# Patient Record
Sex: Female | Born: 1990 | Race: Black or African American | Hispanic: No | Marital: Single | Smoking: Never smoker
Health system: Southern US, Community
[De-identification: ages and names within clinical notes are randomized; demographics above are authoritative.]

---

## 2013-01-21 ENCOUNTER — Encounter (HOSPITAL_COMMUNITY): Payer: Self-pay

## 2013-01-21 ENCOUNTER — Emergency Department (HOSPITAL_COMMUNITY)
Admission: EM | Admit: 2013-01-21 | Discharge: 2013-01-21 | Disposition: A | Discharge status: Home / Self Care | Payer: 59 | Source: Home / Self Care

## 2013-01-21 DIAGNOSIS — B349 Viral infection, unspecified: Secondary | ICD-10-CM

## 2013-01-21 DIAGNOSIS — J069 Acute upper respiratory infection, unspecified: Secondary | ICD-10-CM

## 2013-01-21 DIAGNOSIS — B9789 Other viral agents as the cause of diseases classified elsewhere: Secondary | ICD-10-CM

## 2013-01-21 MED ORDER — FLUTICASONE PROPIONATE 50 MCG/ACT NA SUSP: 2.0000 | Freq: Every day | NASAL | Status: DC

## 2013-01-21 MED ORDER — PHENYLEPHRINE-CHLORPHEN-DM 10-4-12.5 MG/5ML PO LIQD: 5.0000 mL | ORAL | Status: DC | PRN

## 2013-01-21 NOTE — ED Provider Notes (Signed)
CSN: [id number]     Arrival date & time 01/21/13  8469 History   None    Chief Complaint  Patient presents with  . Cough   (Consider location/radiation/quality/duration/timing/severity/associated sxs/prior Treatment) HPI Comments: Awoke feeling poorly yesterday with itchy throat, runny nose, head congestion, Headache , bodyache, occassional nausea without vomiting. Took Sudafed and Loratadine.   No past medical history on file. No past surgical history on file. No family history on file. History  Substance Use Topics  . Smoking status: Not on file  . Smokeless tobacco: Not on file  . Alcohol Use: Not on file   OB History   No data available     Review of Systems  Constitutional: Positive for activity change, appetite change and fatigue. Negative for fever and chills.  HENT: Positive for congestion, sore throat, rhinorrhea and postnasal drip. Negative for facial swelling, neck pain and neck stiffness.   Eyes: Negative.   Respiratory: Negative.   Cardiovascular: Negative.   Gastrointestinal: Positive for nausea. Negative for vomiting.  Musculoskeletal: Positive for myalgias.  Skin: Negative for pallor and rash.  Neurological: Negative.     Allergies  Review of patient's allergies indicates not on file.  Home Medications   Current Outpatient Rx  Name  Route  Sig  Dispense  Refill  . fluticasone (FLONASE) 50 MCG/ACT nasal spray   Nasal   Place 2 sprays into the nose daily.   16 g   2   . Phenylephrine-Chlorphen-DM 02-22-11.5 MG/5ML LIQD   Oral   Take 5 mLs by mouth every 4 (four) hours as needed.   120 mL   0    BP 106/69  Pulse 90  Temp(Src) 100.1 F (37.8 C) (Oral)  Resp 16  SpO2 98% Physical Exam  Nursing note and vitals reviewed. Constitutional: She is oriented to person, place, and time. She appears well-developed and well-nourished. No distress.  HENT:  Head: Normocephalic.  Right Ear: External ear normal.  Left Ear: External ear normal.  Difficult  to visualize due to voluntary tongue retraction with approach to exam, also pulling my arm away. Had one short glimpse. Minor erythema, no bright exudates.   Eyes: Conjunctivae and EOM are normal.  Neck: Normal range of motion. Neck supple.  Cardiovascular: Normal rate and regular rhythm.   Pulmonary/Chest: Effort normal and breath sounds normal. No respiratory distress. She has no rales.  Abdominal: Soft. There is no tenderness.  Musculoskeletal: Normal range of motion.  Lymphadenopathy:    She has no cervical adenopathy.  Neurological: She is alert and oriented to person, place, and time. No cranial nerve deficit.  Skin: Skin is warm and dry.  Psychiatric: She has a normal mood and affect.    ED Course  Procedures (including critical care time) Labs Review Labs Reviewed  POCT RAPID STREP A ([facility] ONLY)   Results for orders placed during the hospital encounter of 01/21/13  POCT RAPID STREP A ([facility] ONLY)      Result Value Range   Streptococcus, Group A Screen (Direct) NEGATIVE  NEGATIVE    Imaging Review No results found.  MDM   1. URI (upper respiratory infection)   2. Viral syndrome    Fluticasone NS as dir Plenty of fluids Norel CS as directed Rest. No work next 2 days      Hayden Rasmussen, NP 01/21/13 1049

## 2013-01-21 NOTE — ED Provider Notes (Signed)
Medical screening examination/treatment/procedure(s) were performed by non-physician practitioner and as supervising physician I was immediately available for consultation/collaboration.   MORENO-COLL,Nyle Limb; MD  Jazzlin Clements Moreno-Coll, MD 01/21/13 1416 

## 2013-01-23 LAB — CULTURE, GROUP A STREP

## 2013-05-19 ENCOUNTER — Emergency Department (HOSPITAL_COMMUNITY)
Admission: EM | Admit: 2013-05-19 | Discharge: 2013-05-19 | Disposition: A | Discharge status: Home / Self Care | Payer: No Typology Code available for payment source | Attending: Emergency Medicine | Admitting: Emergency Medicine

## 2013-05-19 ENCOUNTER — Emergency Department (HOSPITAL_COMMUNITY): Payer: No Typology Code available for payment source

## 2013-05-19 ENCOUNTER — Encounter (HOSPITAL_COMMUNITY): Payer: Self-pay | Admitting: Emergency Medicine

## 2013-05-19 DIAGNOSIS — S6990XA Unspecified injury of unspecified wrist, hand and finger(s), initial encounter: Secondary | ICD-10-CM | POA: Insufficient documentation

## 2013-05-19 DIAGNOSIS — S298XXA Other specified injuries of thorax, initial encounter: Secondary | ICD-10-CM | POA: Insufficient documentation

## 2013-05-19 DIAGNOSIS — S0990XA Unspecified injury of head, initial encounter: Secondary | ICD-10-CM | POA: Insufficient documentation

## 2013-05-19 DIAGNOSIS — Y9389 Activity, other specified: Secondary | ICD-10-CM | POA: Insufficient documentation

## 2013-05-19 DIAGNOSIS — Y92410 Unspecified street and highway as the place of occurrence of the external cause: Secondary | ICD-10-CM | POA: Insufficient documentation

## 2013-05-19 DIAGNOSIS — S59909A Unspecified injury of unspecified elbow, initial encounter: Secondary | ICD-10-CM | POA: Insufficient documentation

## 2013-05-19 DIAGNOSIS — S161XXA Strain of muscle, fascia and tendon at neck level, initial encounter: Secondary | ICD-10-CM

## 2013-05-19 DIAGNOSIS — S139XXA Sprain of joints and ligaments of unspecified parts of neck, initial encounter: Secondary | ICD-10-CM | POA: Insufficient documentation

## 2013-05-19 DIAGNOSIS — Z79899 Other long term (current) drug therapy: Secondary | ICD-10-CM | POA: Insufficient documentation

## 2013-05-19 MED ORDER — CYCLOBENZAPRINE HCL 10 MG PO TABS: 10.0000 mg | ORAL_TABLET | Freq: Two times a day (BID) | ORAL | Status: DC | PRN
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
## 2013-05-20 NOTE — ED Provider Notes (Signed)
Medical screening examination/treatment/procedure(s) were performed by non-physician practitioner and as supervising physician I was immediately available for consultation/collaboration.  Nabria Nevin L Messiyah Waterson, MD 05/20/13 1850 

## 2013-11-25 ENCOUNTER — Emergency Department (HOSPITAL_COMMUNITY)
Admission: EM | Admit: 2013-11-25 | Discharge: 2013-11-25 | Disposition: A | Discharge status: Home / Self Care | Payer: 59 | Attending: Emergency Medicine | Admitting: Emergency Medicine

## 2013-11-25 ENCOUNTER — Emergency Department (HOSPITAL_COMMUNITY): Payer: 59

## 2013-11-25 ENCOUNTER — Encounter (HOSPITAL_COMMUNITY): Payer: Self-pay | Admitting: Emergency Medicine

## 2013-11-25 DIAGNOSIS — R51 Headache: Secondary | ICD-10-CM | POA: Insufficient documentation

## 2013-11-25 DIAGNOSIS — H53149 Visual discomfort, unspecified: Secondary | ICD-10-CM | POA: Diagnosis not present

## 2013-11-25 DIAGNOSIS — R519 Headache, unspecified: Secondary | ICD-10-CM

## 2013-11-25 DIAGNOSIS — R112 Nausea with vomiting, unspecified: Secondary | ICD-10-CM | POA: Insufficient documentation

## 2013-11-25 MED ORDER — ONDANSETRON 8 MG PO TBDP: 8.0000 mg | ORAL_TABLET | Freq: Three times a day (TID) | ORAL | Status: AC | PRN

## 2013-11-25 MED ORDER — ONDANSETRON HCL 4 MG/2ML IJ SOLN
4.0000 mg | Freq: Once | INTRAMUSCULAR | Status: AC
  Administered 2013-11-25: 4 mg via INTRAVENOUS
  Filled 2013-11-25: qty 2

## 2013-11-25 MED ORDER — HYDROMORPHONE HCL PF 1 MG/ML IJ SOLN
0.5000 mg | INTRAMUSCULAR | Status: DC | PRN
  Filled 2013-11-25: qty 1

## 2013-11-25 MED ORDER — METOCLOPRAMIDE HCL 5 MG/ML IJ SOLN
10.0000 mg | Freq: Once | INTRAMUSCULAR | Status: AC
  Administered 2013-11-25: 10 mg via INTRAVENOUS
  Filled 2013-11-25: qty 2

## 2013-11-25 MED ORDER — SODIUM CHLORIDE 0.9 % IV SOLN
1000.0000 mL | INTRAVENOUS | Status: DC
  Administered 2013-11-25: 1000 mL via INTRAVENOUS

## 2013-11-25 MED ORDER — NAPROXEN 500 MG PO TABS: 500.0000 mg | ORAL_TABLET | Freq: Two times a day (BID) | ORAL | Status: AC

## 2013-11-25 MED ORDER — SODIUM CHLORIDE 0.9 % IV SOLN
1000.0000 mL | Freq: Once | INTRAVENOUS | Status: AC
  Administered 2013-11-25: 1000 mL via INTRAVENOUS

## 2013-11-25 MED ORDER — HYDROCODONE-ACETAMINOPHEN 5-325 MG PO TABS: 1.0000 | ORAL_TABLET | ORAL | Status: AC | PRN

## 2013-11-25 MED ORDER — KETOROLAC TROMETHAMINE 30 MG/ML IJ SOLN
30.0000 mg | Freq: Once | INTRAMUSCULAR | Status: AC
  Administered 2013-11-25: 30 mg via INTRAVENOUS
  Filled 2013-11-25: qty 1

## 2013-11-25 NOTE — ED Notes (Signed)
EMS - Pt coming from work with sudden onset of severe headache with nausea and vomiting x5 this morning.  Pt states she started exercising yesterday and may have over did it.  Pt has light sensitivity and blurred vision bilaterally, denies neck stiffness.  Pt given 4mg  Zofran with EMS.  Pt is alert and oriented.

## 2013-11-25 NOTE — ED Notes (Signed)
Pt alert and oriented at discharge.  Pt offered a wheelchair but declined, ambulated to the waiting room.

## 2013-11-25 NOTE — ED Provider Notes (Signed)
CSN: [id number]     Arrival date & time 11/25/13  1109 History   First MD Initiated Contact with Patient 11/25/13 1113     Chief Complaint  Patient presents with  . Headache     HPI Patient presents with headache that began today.  Initially it started out gradually but then quickly worsened.  She reports photophobia.  She reports nausea and vomiting.  No recent trauma.  No recent illness.  No fevers or chills.  She had one headache similar to this many years ago but does not carry a history of migraine headaches.  She was involved in more extensive exercise yesterday that is typical for her.  She feels as though her hydration status has been well.  Denies bloody emesis.  No abdominal pain.  No neck pain.   History reviewed. No pertinent past medical history. History reviewed. No pertinent past surgical history. History reviewed. No pertinent family history. History  Substance Use Topics  . Smoking status: Never Smoker   . Smokeless tobacco: Not on file  . Alcohol Use: Yes     Comment: occassional   OB History   Grav Para Term Preterm Abortions TAB SAB Ect Mult Living                 Review of Systems  All other systems reviewed and are negative.     Allergies  Review of patient's allergies indicates no known allergies.  Home Medications   Prior to Admission medications   Medication Sig Start Date End Date Taking? Authorizing Provider  HYDROcodone-acetaminophen (NORCO/VICODIN) 5-325 MG per tablet Take 1 tablet by mouth every 4 (four) hours as needed for moderate pain. 11/25/13   Lyanne CoKevin M Damari Suastegui, MD  naproxen (NAPROSYN) 500 MG tablet Take 1 tablet (500 mg total) by mouth 2 (two) times daily. 11/25/13   Lyanne CoKevin M Javonne Louissaint, MD  ondansetron (ZOFRAN ODT) 8 MG disintegrating tablet Take 1 tablet (8 mg total) by mouth every 8 (eight) hours as needed for nausea or vomiting. 11/25/13   Lyanne CoKevin M Sharanda Shinault, MD   BP 92/72  Pulse 88  Temp(Src) 97.7 F (36.5 C) (Oral)  Resp 16  Ht 5\' 3"  (1.6 m)   Wt 125 lb (56.7 kg)  BMI 22.15 kg/m2  SpO2 100%  LMP 11/10/2013 Physical Exam  Nursing note and vitals reviewed. Constitutional: She is oriented to person, place, and time. She appears well-developed and well-nourished. No distress.  HENT:  Head: Normocephalic and atraumatic.  Eyes: EOM are normal. Pupils are equal, round, and reactive to light.  Neck: Normal range of motion.  Cardiovascular: Normal rate, regular rhythm and normal heart sounds.   Pulmonary/Chest: Effort normal and breath sounds normal.  Abdominal: Soft. She exhibits no distension. There is no tenderness.  Musculoskeletal: Normal range of motion.  Neurological: She is alert and oriented to person, place, and time.  5/5 strength in major muscle groups of  bilateral upper and lower extremities. Speech normal. No facial asymetry.   Skin: Skin is warm and dry.  Psychiatric: She has a normal mood and affect. Judgment normal.    ED Course  Procedures (including critical care time) Labs Review Labs Reviewed - No data to display  Imaging Review Ct Head Wo Contrast  11/25/2013   CLINICAL DATA:  Acute onset of severe headache with nausea and vomiting, light sensitivity, blurred vision  EXAM: CT HEAD WITHOUT CONTRAST  TECHNIQUE: Contiguous axial images were obtained from the base of the skull through the vertex without  intravenous contrast.  COMPARISON:  None.  FINDINGS: Normal ventricular morphology.  No midline shift or mass effect.  Normal appearance of brain parenchyma.  No intracranial hemorrhage, mass lesion, or acute infarction.  Visualized paranasal sinuses and mastoid air cells clear.  Bones unremarkable.  IMPRESSION: Normal exam.   Electronically Signed   By: Ulyses SouthwardMark  Boles M.D.   On: 11/25/2013 12:46  I personally reviewed the imaging tests through PACS system I reviewed available ER/hospitalization records through the EMR    EKG Interpretation None      MDM   Final diagnoses:  Nonintractable headache, unspecified  chronicity pattern, unspecified headache type    Suspect migraine variant.  Patient feels much better after management in the emergency department.  Head CT obtained as the patient's family was extremely concerned and felt better after a negative head CT.  Low suspicion for so rec hemorrhage.  Discharge home with instructions to return to the ER for new or worsening symptoms    Lyanne CoKevin M Kieron Kantner, MD 11/25/13 1339

## 2015-09-17 IMAGING — CT CT HEAD W/O CM
1 series · 16 of 30 positions shown, 20 images · non-contrast
Comparison: None.

CLINICAL DATA: Acute onset of severe headache with nausea and
vomiting, light sensitivity, blurred vision

EXAM:
CT HEAD WITHOUT CONTRAST
TECHNIQUE: Contiguous axial images were obtained from the base of the skull
through the vertex without intravenous contrast.

[Series 2: head 5.0 h30s · axial · 0.44mm/px · z∈[-154,-14]mm · 16 of 32 slices shown, 20 images]
[im 2/32  brain]
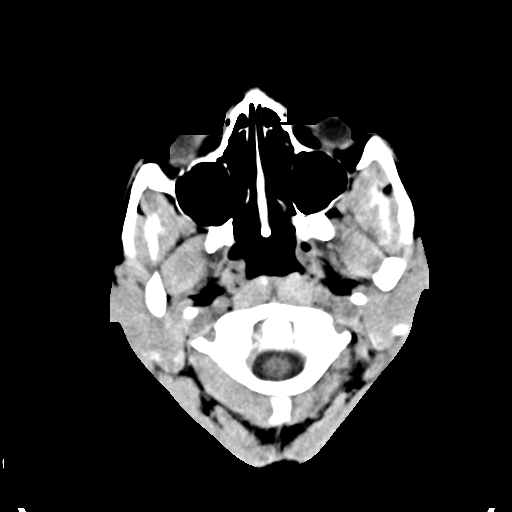
[im 2/32  bone]
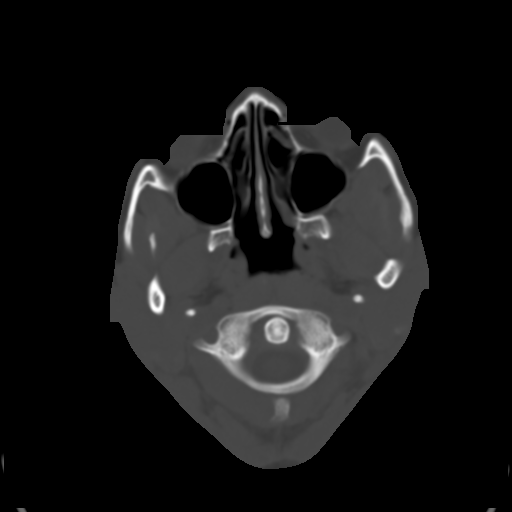
[im 4/32  brain]
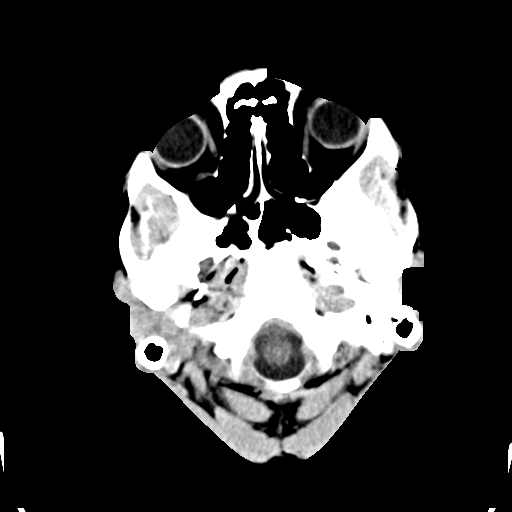
[im 6/32  brain]
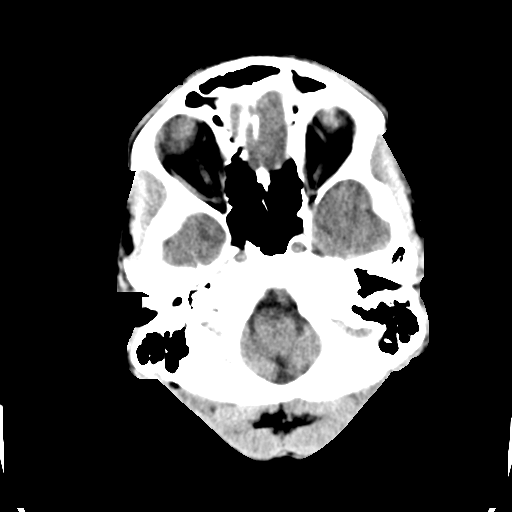
[im 8/32  brain]
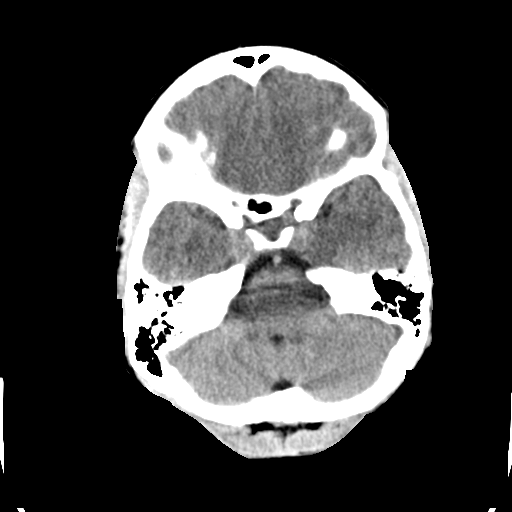
[im 9/32  brain]
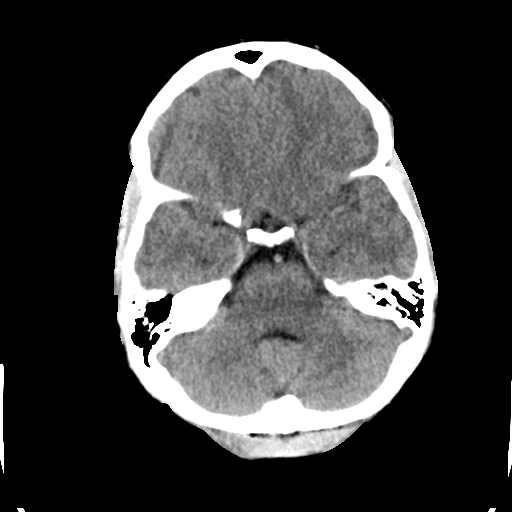
[im 9/32  bone]
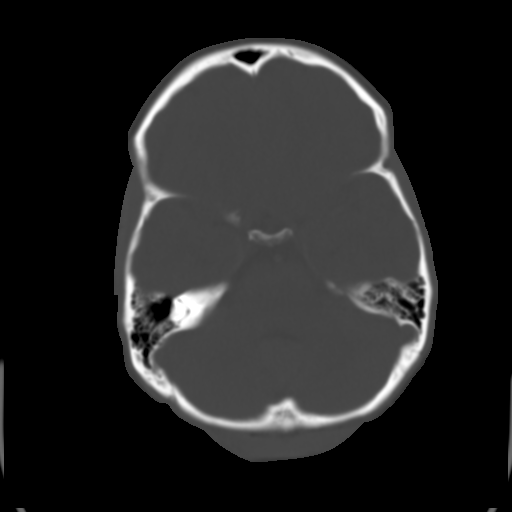
[im 11/32  brain]
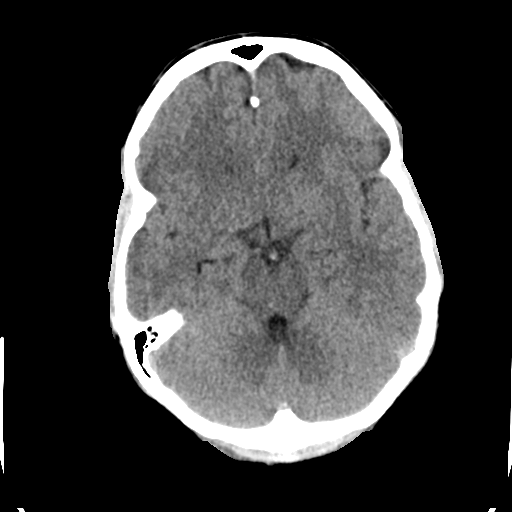
[im 13/32  brain]
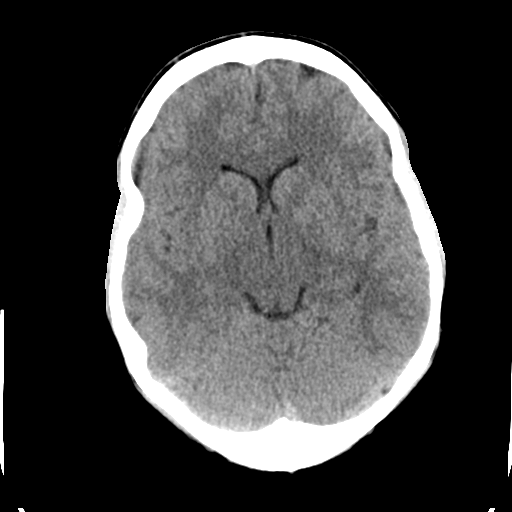
[im 15/32  brain]
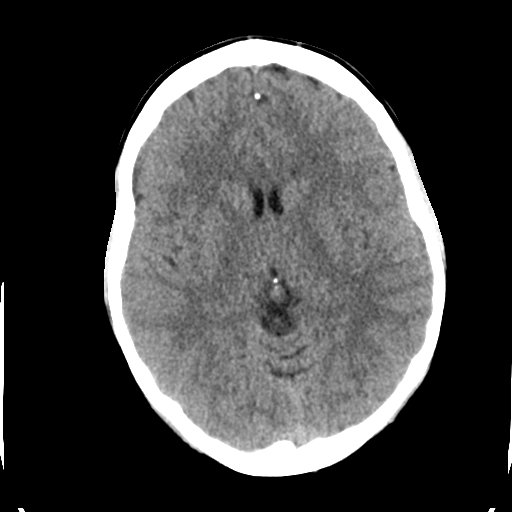
[im 17/32  brain]
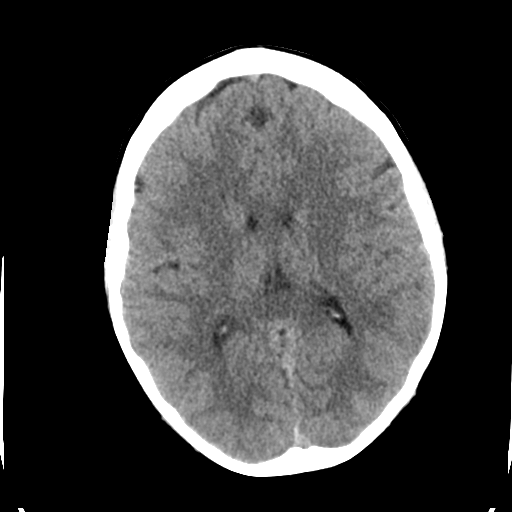
[im 17/32  bone]
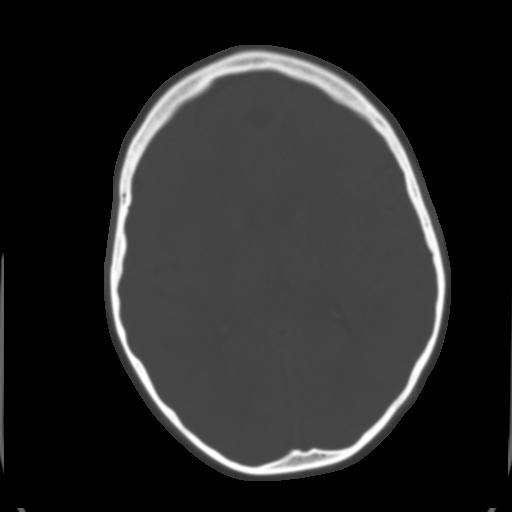
[im 19/32  brain]
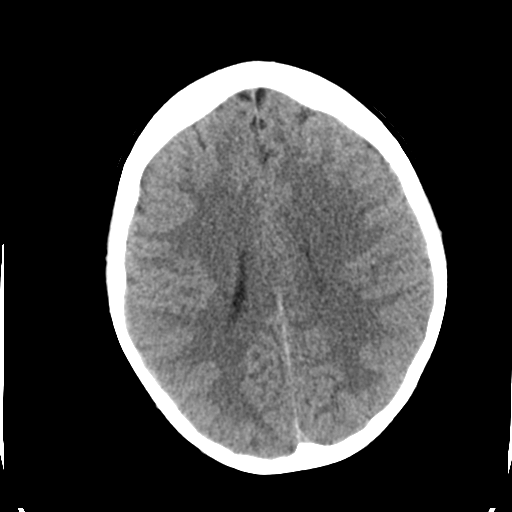
[im 21/32  brain]
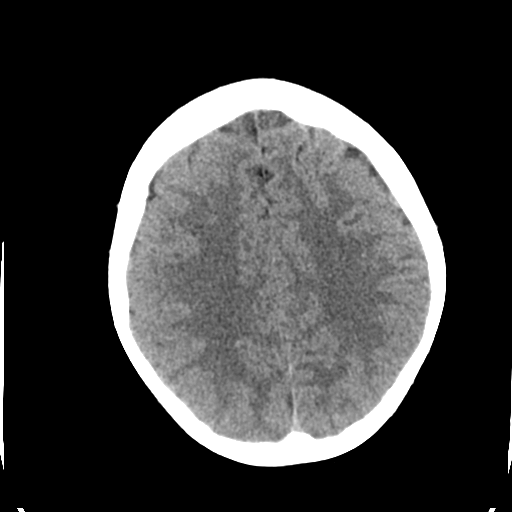
[im 23/32  brain]
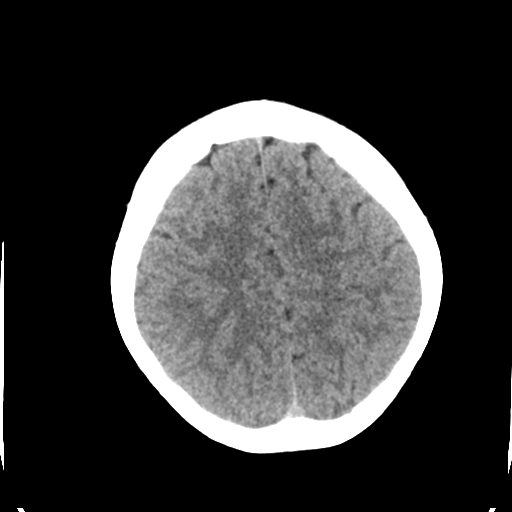
[im 24/32  brain]
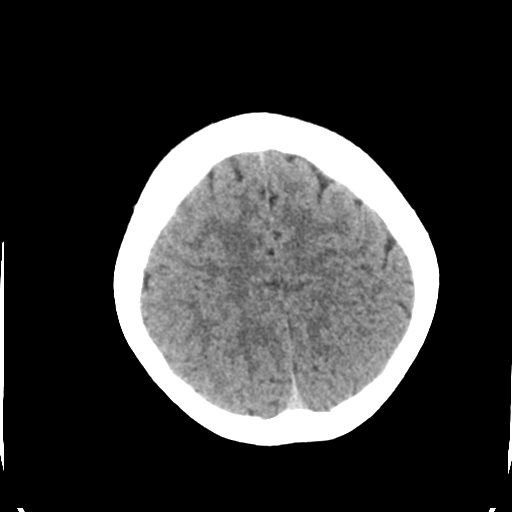
[im 24/32  bone]
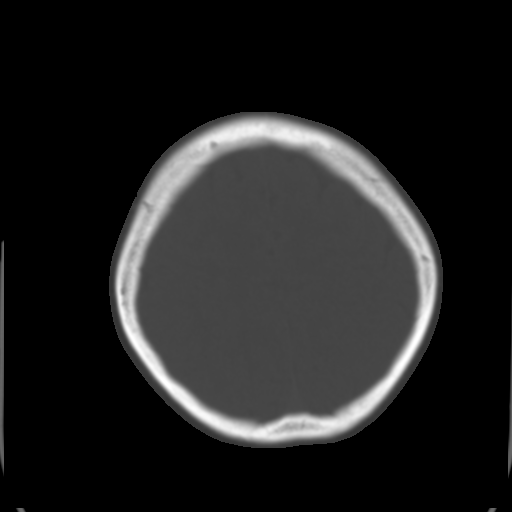
[im 26/32  brain]
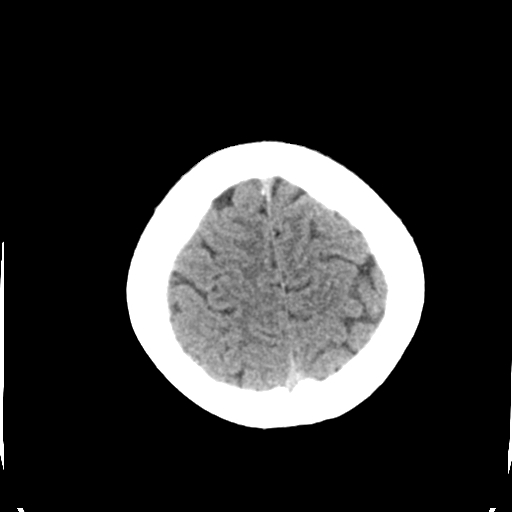
[im 28/32  brain]
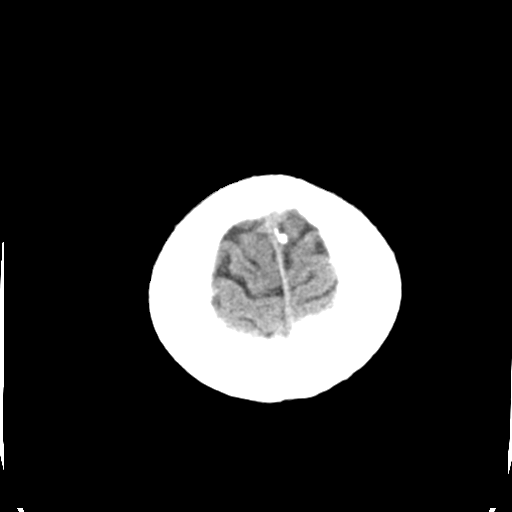
[im 30/32  brain]
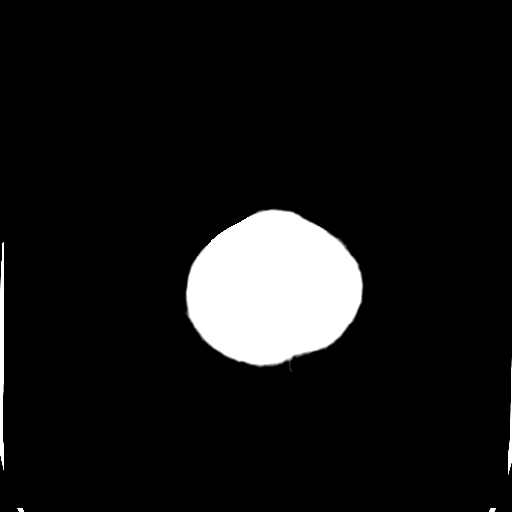

[16 of 30 positions shown; findings below may reference images not displayed]

FINDINGS: Normal ventricular morphology.

No midline shift or mass effect.

Normal appearance of brain parenchyma.

No intracranial hemorrhage, mass lesion, or acute infarction.

Visualized paranasal sinuses and mastoid air cells clear.

Bones unremarkable.
IMPRESSION: Normal exam.
